# Patient Record
Sex: Male | Born: 1960 | Race: Black or African American | Hispanic: No | Marital: Married | Smoking: Never smoker
Health system: Southern US, Community
[De-identification: ages and names within clinical notes are randomized; demographics above are authoritative.]

---

## 2008-02-14 ENCOUNTER — Emergency Department: Payer: Self-pay | Admitting: Emergency Medicine

## 2009-03-11 ENCOUNTER — Emergency Department (HOSPITAL_COMMUNITY): Admission: EM | Admit: 2009-03-11 | Discharge: 2009-03-11 | Payer: Self-pay | Admitting: Emergency Medicine

## 2009-05-06 ENCOUNTER — Ambulatory Visit (HOSPITAL_COMMUNITY): Admission: RE | Admit: 2009-05-06 | Discharge: 2009-05-06 | Payer: Self-pay | Admitting: Chiropractic Medicine

## 2014-10-27 ENCOUNTER — Emergency Department
Admit: 2014-10-27 | Disposition: A | Discharge status: Home / Self Care | Payer: Self-pay | Admitting: Emergency Medicine

## 2014-10-27 LAB — COMPREHENSIVE METABOLIC PANEL
ALBUMIN: 4.3 g/dL
ALK PHOS: 51 U/L
ALT: 22 U/L
ANION GAP: 9 (ref 7–16)
BUN: 15 mg/dL
Bilirubin,Total: 0.8 mg/dL
CALCIUM: 8.8 mg/dL — AB
CO2: 27 mmol/L
Chloride: 103 mmol/L
Creatinine: 1.03 mg/dL
EGFR (African American): >60
EGFR (Non-African Amer.): >60
GLUCOSE: 98 mg/dL
POTASSIUM: 3.5 mmol/L
SGOT(AST): 21 U/L
SODIUM: 139 mmol/L
Total Protein: 7.2 g/dL

## 2014-10-27 LAB — CBC WITH DIFFERENTIAL/PLATELET
BASOS ABS: 0 10*3/uL (ref 0.0–0.1)
Basophil %: 0.4 %
EOS PCT: 0.2 %
Eosinophil #: 0 10*3/uL (ref 0.0–0.7)
HCT: 43.3 % (ref 40.0–52.0)
HGB: 14.3 g/dL (ref 13.0–18.0)
LYMPHS ABS: 0.9 10*3/uL — AB (ref 1.0–3.6)
Lymphocyte %: 12.6 %
MCH: 27 pg (ref 26.0–34.0)
MCHC: 33 g/dL (ref 32.0–36.0)
MCV: 82 fL (ref 80–100)
MONOS PCT: 13.6 %
Monocyte #: 1 x10 3/mm (ref 0.2–1.0)
NEUTROS ABS: 5.4 10*3/uL (ref 1.4–6.5)
Neutrophil %: 73.2 %
PLATELETS: 185 10*3/uL (ref 150–440)
RBC: 5.3 10*6/uL (ref 4.40–5.90)
RDW: 14.3 % (ref 11.5–14.5)
WBC: 7.4 10*3/uL (ref 3.8–10.6)

## 2014-10-27 LAB — TROPONIN I: Troponin-I: <0.03 ng/mL

## 2014-10-27 LAB — LIPASE, BLOOD: LIPASE: 31 U/L

## 2014-10-28 LAB — URINALYSIS, COMPLETE
BACTERIA: NONE SEEN
Bilirubin,UR: NEGATIVE
Blood: NEGATIVE
Ketone: NEGATIVE
LEUKOCYTE ESTERASE: NEGATIVE
NITRITE: NEGATIVE
PROTEIN: NEGATIVE
Ph: 5 (ref 4.5–8.0)
SPECIFIC GRAVITY: 1.014 (ref 1.003–1.030)
Squamous Epithelial: NONE SEEN

## 2014-10-28 LAB — DRUG SCREEN, URINE
Amphetamines, Ur Screen: NEGATIVE
BARBITURATES, UR SCREEN: NEGATIVE
Benzodiazepine, Ur Scrn: NEGATIVE
COCAINE METABOLITE, UR ~~LOC~~: NEGATIVE
Cannabinoid 50 Ng, Ur ~~LOC~~: NEGATIVE
MDMA (Ecstasy)Ur Screen: NEGATIVE
METHADONE, UR SCREEN: NEGATIVE
OPIATE, UR SCREEN: NEGATIVE
PHENCYCLIDINE (PCP) UR S: NEGATIVE
TRICYCLIC, UR SCREEN: NEGATIVE

## 2019-07-21 ENCOUNTER — Encounter: Payer: Self-pay | Admitting: Emergency Medicine

## 2019-07-21 ENCOUNTER — Other Ambulatory Visit: Payer: Self-pay

## 2019-07-21 ENCOUNTER — Emergency Department
Admission: EM | Admit: 2019-07-21 | Discharge: 2019-07-21 | Disposition: A | Discharge status: Home / Self Care | Payer: Self-pay | Attending: Emergency Medicine | Admitting: Emergency Medicine

## 2019-07-21 DIAGNOSIS — M436 Torticollis: Secondary | ICD-10-CM | POA: Insufficient documentation

## 2019-07-21 MED ORDER — TRAMADOL HCL 50 MG PO TABS: 50.0000 mg | ORAL_TABLET | Freq: Four times a day (QID) | ORAL | 0 refills | Status: DC | PRN

## 2019-07-21 MED ORDER — IBUPROFEN 600 MG PO TABS: 600.0000 mg | ORAL_TABLET | Freq: Three times a day (TID) | ORAL | 0 refills | Status: DC | PRN

## 2019-07-21 MED ORDER — CYCLOBENZAPRINE HCL 10 MG PO TABS: 10.0000 mg | ORAL_TABLET | Freq: Three times a day (TID) | ORAL | 0 refills | Status: DC | PRN

## 2019-07-21 NOTE — Discharge Instructions (Addendum)
Follow discharge care instruction take medication as directed.  Be advised medication may cause drowsiness. 

## 2019-07-21 NOTE — ED Provider Notes (Signed)
[facility] Emergency Department Provider Note   ____________________________________________   First MD Initiated Contact with Patient 07/21/19 [phone]     (approximate)  I have reviewed the triage vital signs and the nursing notes.   HISTORY  Chief Complaint Torticollis    HPI Casey Mccormick is a 59 y.o. male patient complain of worsening rib pain for 1 week.  Patient state pain causes headache and has decreased range of motion with lateral movements.  This is a recurrent condition of patient last episode was several months ago.  Patient denies radicular component to his neck pain.  Patient job contributes to this complaint.  Patient works as a Hospital doctor of a truck.  Patient rates pain as 8/10.  Patient described pain is "achy".  No relief with anti-inflammatory medications.        History reviewed. No pertinent past medical history.  There are no problems to display for this patient.   History reviewed. No pertinent surgical history.  Prior to Admission medications   Medication Sig Start Date End Date Taking? Authorizing Provider  cyclobenzaprine (FLEXERIL) 10 MG tablet Take 1 tablet (10 mg total) by mouth 3 (three) times daily as needed. 07/21/19   Joni Reining, PA-C  ibuprofen (ADVIL) 600 MG tablet Take 1 tablet (600 mg total) by mouth every 8 (eight) hours as needed. 07/21/19   Joni Reining, PA-C  traMADol (ULTRAM) 50 MG tablet Take 1 tablet (50 mg total) by mouth every 6 (six) hours as needed. 07/21/19 07/20/20  Joni Reining, PA-C    Allergies Patient has no allergy information on record.  No family history on file.  Social History Social History   Tobacco Use  . Smoking status: Not on file  Substance Use Topics  . Alcohol use: Not on file  . Drug use: Not on file    Review of Systems Constitutional: No fever/chills Eyes: No visual changes. ENT: No sore throat. Cardiovascular: Denies chest pain. Respiratory: Denies shortness of  breath. Gastrointestinal: No abdominal pain.  No nausea, no vomiting.  No diarrhea.  No constipation. Genitourinary: Negative for dysuria. Musculoskeletal: Posterior lateral neck pain. Skin: Negative for rash. Neurological: Positive for headaches, but denies focal weakness or numbness.   ____________________________________________   PHYSICAL EXAM:  VITAL SIGNS: ED Triage Vitals  Enc Vitals Group     BP 07/21/19 0751 133/65     Pulse Rate 07/21/19 0751 93     Resp 07/21/19 0751 18     Temp 07/21/19 0751 98.3 F (36.8 C)     Temp Source 07/21/19 0751 Oral     SpO2 07/21/19 0751 99 %     Weight 07/21/19 0744 194 lb (88 kg)     Height 07/21/19 0744 6\' 2"  (1.88 m)     Head Circumference --      Peak Flow --      Pain Score 07/21/19 0744 8     Pain Loc --      Pain Edu? --      Excl. in GC? --    Constitutional: Alert and oriented. Well appearing and in no acute distress. Neck: No stridor.  No cervical spine tenderness to palpation.  Decreased range of motion with lateral movements by complaint of pain. Cardiovascular: Normal rate, regular rhythm. Grossly normal heart sounds.  Good peripheral circulation. Respiratory: Normal respiratory effort.  No retractions. Lungs CTAB. Neurologic:  Normal speech and language. No gross focal neurologic deficits are appreciated. No gait instability. Skin:  Skin is warm, dry and intact. No rash noted. Psychiatric: Mood and affect are normal. Speech and behavior are normal.  ____________________________________________   LABS (all labs ordered are listed, but only abnormal results are displayed)  Labs Reviewed - No data to display ____________________________________________  EKG   ____________________________________________  RADIOLOGY  ED MD interpretation:    Official radiology report(s): No results found.  ____________________________________________   PROCEDURES  Procedure(s) performed (including Critical  Care):  Procedures   ____________________________________________   INITIAL IMPRESSION / ASSESSMENT AND PLAN / ED COURSE  As part of my medical decision making, I reviewed the following data within the [place]     Patient presents with 1 week of neck pain.  Physical exam consistent with torticollis.  Patient given discharge care instructions.  Patient advised take medication as directed.  Patient advised medication may cause drowsiness.  Patient advised establish care open-door clinic for continued care.   Broox Lonigro was evaluated in Emergency Department on 07/21/2019 for the symptoms described in the history of present illness. He was evaluated in the context of the global COVID-19 pandemic, which necessitated consideration that the patient might be at risk for infection with the SARS-CoV-2 virus that causes COVID-19. Institutional protocols and algorithms that pertain to the evaluation of patients at risk for COVID-19 are in a state of rapid change based on information released by regulatory bodies including the CDC and federal and state organizations. These policies and algorithms were followed during the patient's care in the ED.       ____________________________________________   FINAL CLINICAL IMPRESSION(S) / ED DIAGNOSES  Final diagnoses:  Torticollis, acute     ED Discharge Orders         Ordered    traMADol (ULTRAM) 50 MG tablet  Every 6 hours PRN     07/21/19 0817    cyclobenzaprine (FLEXERIL) 10 MG tablet  3 times daily PRN     07/21/19 0817    ibuprofen (ADVIL) 600 MG tablet  Every 8 hours PRN     07/21/19 0817           Note:  This document was prepared using Dragon voice recognition software and may include unintentional dictation errors.    Sable Feil, PA-C 07/21/19 3810    Earleen Newport, MD 07/21/19 [phone]

## 2019-07-21 NOTE — ED Triage Notes (Signed)
Pt reports he woke up about a week ago and has some pain to his neck. Pt reports the pain has gotten worse over the past week and now he has a HA and it hurts to look left or right, especially left.

## 2019-07-21 NOTE — ED Notes (Signed)
See triage note  Presents with neck pain  States he woke up with pain to neck about 1 week ago   Stats pain increases with movement and now is moving into left shoulder area

## 2019-11-23 ENCOUNTER — Encounter: Payer: Self-pay | Admitting: Emergency Medicine

## 2019-11-23 ENCOUNTER — Emergency Department
Admission: EM | Admit: 2019-11-23 | Discharge: 2019-11-23 | Disposition: A | Discharge status: Home / Self Care | Payer: Self-pay | Attending: Emergency Medicine | Admitting: Emergency Medicine

## 2019-11-23 ENCOUNTER — Other Ambulatory Visit: Payer: Self-pay

## 2019-11-23 DIAGNOSIS — Z5321 Procedure and treatment not carried out due to patient leaving prior to being seen by health care provider: Secondary | ICD-10-CM | POA: Insufficient documentation

## 2019-11-23 DIAGNOSIS — M542 Cervicalgia: Secondary | ICD-10-CM | POA: Insufficient documentation

## 2019-11-23 DIAGNOSIS — M25519 Pain in unspecified shoulder: Secondary | ICD-10-CM | POA: Insufficient documentation

## 2019-11-23 LAB — CBC WITH DIFFERENTIAL/PLATELET
Abs Immature Granulocytes: 0.02 10*3/uL (ref 0.00–0.07)
Basophils Absolute: 0 10*3/uL (ref 0.0–0.1)
Basophils Relative: 1 %
Eosinophils Absolute: 0 10*3/uL (ref 0.0–0.5)
Eosinophils Relative: 1 %
HCT: 40.7 % (ref 39.0–52.0)
Hemoglobin: 13.6 g/dL (ref 13.0–17.0)
Immature Granulocytes: 0 %
Lymphocytes Relative: 42 %
Lymphs Abs: 2.1 10*3/uL (ref 0.7–4.0)
MCH: 27 pg (ref 26.0–34.0)
MCHC: 33.4 g/dL (ref 30.0–36.0)
MCV: 80.8 fL (ref 80.0–100.0)
Monocytes Absolute: 0.6 10*3/uL (ref 0.1–1.0)
Monocytes Relative: 12 %
Neutro Abs: 2.1 10*3/uL (ref 1.7–7.7)
Neutrophils Relative %: 44 %
Platelets: 213 10*3/uL (ref 150–400)
RBC: 5.04 MIL/uL (ref 4.22–5.81)
RDW: 13.8 % (ref 11.5–15.5)
WBC: 4.9 10*3/uL (ref 4.0–10.5)
nRBC: 0 % (ref 0.0–0.2)

## 2019-11-23 LAB — BASIC METABOLIC PANEL
Anion gap: 9 (ref 5–15)
BUN: 19 mg/dL (ref 6–20)
CO2: 28 mmol/L (ref 22–32)
Calcium: 9.5 mg/dL (ref 8.9–10.3)
Chloride: 104 mmol/L (ref 98–111)
Creatinine, Ser: 1.21 mg/dL (ref 0.61–1.24)
GFR calc Af Amer: >60 mL/min (ref >60)
GFR calc non Af Amer: >60 mL/min (ref >60)
Glucose, Bld: 110 mg/dL — ABNORMAL HIGH (ref 70–99)
Potassium: 4.5 mmol/L (ref 3.5–5.1)
Sodium: 141 mmol/L (ref 135–145)

## 2019-11-23 NOTE — ED Triage Notes (Signed)
Pt arrives ambulatory to triage with c/o shoulder pain and neck swelling x 4 nights. Pt denies chest pain or other cardiac symptoms at this time including arm pain. Pt denies injury or trauma. Pt is in NAD.

## 2019-11-27 ENCOUNTER — Other Ambulatory Visit: Payer: Self-pay

## 2019-11-27 ENCOUNTER — Encounter: Payer: Self-pay | Admitting: Emergency Medicine

## 2019-11-27 ENCOUNTER — Emergency Department
Admission: EM | Admit: 2019-11-27 | Discharge: 2019-11-27 | Disposition: A | Discharge status: Home / Self Care | Payer: Self-pay | Attending: Emergency Medicine | Admitting: Emergency Medicine

## 2019-11-27 ENCOUNTER — Emergency Department: Payer: Self-pay

## 2019-11-27 DIAGNOSIS — M436 Torticollis: Secondary | ICD-10-CM | POA: Insufficient documentation

## 2019-11-27 MED ORDER — KETOROLAC TROMETHAMINE 30 MG/ML IJ SOLN
30.0000 mg | Freq: Once | INTRAMUSCULAR | Status: AC
  Administered 2019-11-27: 30 mg via INTRAMUSCULAR
  Filled 2019-11-27: qty 1

## 2019-11-27 NOTE — Discharge Instructions (Addendum)
Patient was given instructions that were printed off on downtime information.

## 2019-11-27 NOTE — ED Provider Notes (Signed)
[facility] Emergency Department Provider Note   ____________________________________________   First MD Initiated Contact with Patient 11/27/19 1038     (approximate)  I have reviewed the triage vital signs and the nursing notes.   HISTORY  Chief Complaint Neck Pain and Shoulder Pain    HPI Casey Mccormick is a 59 y.o. male presents to the ED with complaint of left-sided cervical pain that started approximately 2 weeks ago.  He states for the last week it is gotten worse.  Patient denies any injury to his neck.  He describes it as a "tightness" and pain is increased with lateral range of motion.  He denies any paresthesias into his upper extremities.  There is been no weakness with lifting.  Patient reports that he came to the ED 4 days ago but left without being seen due to long wait time.  He rates pain as a 10/10.       History reviewed. No pertinent past medical history.  There are no problems to display for this patient.   History reviewed. No pertinent surgical history.  Prior to Admission medications   Not on File    Allergies Patient has no known allergies.  No family history on file.  Social History Social History   Tobacco Use  . Smoking status: Never Smoker  . Smokeless tobacco: Never Used  Substance Use Topics  . Alcohol use: Never  . Drug use: Never    Review of Systems Constitutional: No fever/chills Eyes: No visual changes. ENT: No sore throat. Cardiovascular: Denies chest pain. Respiratory: Denies shortness of breath. Gastrointestinal: No abdominal pain.  No nausea, no vomiting.  Musculoskeletal: As of her cervical pain. Skin: Negative for rash. Neurological: Negative for headaches, focal weakness or numbness. ____________________________________________   PHYSICAL EXAM:  VITAL SIGNS: ED Triage Vitals [11/27/19 1016]  Enc Vitals Group     BP 126/74     Pulse Rate 75     Resp 16     Temp 98.4 F (36.9 C)       Temp Source Oral     SpO2 99 %     Weight 192 lb (87.1 kg)     Height 6\' 2"  (1.88 m)     Head Circumference      Peak Flow      Pain Score 10     Pain Loc      Pain Edu?      Excl. in GC?    Constitutional: Alert and oriented. Well appearing and in no acute distress. Eyes: Conjunctivae are normal. PERRL. EOMI. Head: Atraumatic. Nose: No congestion/rhinnorhea. Mouth/Throat: Mucous membranes are moist.  Oropharynx non-erythematous. Neck: No stridor.  No point tenderness on palpation of cervical spine posteriorly.  There is moderate tenderness on palpation of the left cervical muscles and into the left trapezius muscle.  Range of motion with flexion extension is without any difficulty.  Lateral movement especially to the left is more difficult. Hematological/Lymphatic/Immunilogical: No cervical lymphadenopathy. Cardiovascular: Normal rate, regular rhythm. Grossly normal heart sounds.  Good peripheral circulation. Respiratory: Normal respiratory effort.  No retractions. Lungs CTAB. Musculoskeletal: Moves upper and lower extremities that any difficulty.  There is no tenderness on palpation of the thoracic or lumbar spine.  Good muscle strength bilaterally.  Patient is able to ambulate without any assistance. Neurologic:  Normal speech and language. No gross focal neurologic deficits are appreciated. No gait instability. Skin:  Skin is warm, dry and intact. Psychiatric: Mood and affect are  normal. Speech and behavior are normal.  ____________________________________________   LABS (all labs ordered are listed, but only abnormal results are displayed)  Labs Reviewed - No data to display  RADIOLOGY   Official radiology report(s): DG Cervical Spine 2-3 Views  Result Date: 11/27/2019 CLINICAL DATA:  Cervicalgia EXAM: CERVICAL SPINE - 2-3 VIEW COMPARISON:  None. FINDINGS: Frontal, lateral, and open-mouth odontoid images were obtained. There is no fracture or spondylolisthesis.  Prevertebral soft tissues and predental space regions are normal. There is moderate disc space narrowing at C5-6 and C6-7 with slightly milder disc space narrowing at C4-5 and C7-T1. There are anterior osteophytes at C3, C4, C5, and C6. Foci of calcification in the anterior longitudinal ligament noted at C4-5 and C6-7. No erosive changes. There is lack of lordosis in the cervical region. Lung apices are clear. IMPRESSION: Osteoarthritic change at several levels. No fracture or spondylolisthesis. Lack of cervical lordosis likely represents a degree of muscle spasm. Electronically Signed   By: Lowella Grip III M.D.   On: 11/27/2019 11:40    ____________________________________________   PROCEDURES  Procedure(s) performed (including Critical Care):  Procedures  ____________________________________________   INITIAL IMPRESSION / ASSESSMENT AND PLAN / ED COURSE  As part of my medical decision making, I reviewed the following data within the electronic MEDICAL RECORD NUMBER Notes from prior ED visits and [place] Controlled Substance Database   59 year old male presents to the ED with complaint of left-sided cervical pain without history of injury for the last 6 days.  Patient range of motion is decreased secondary to discomfort.  No gross deformity was noted.  There is moderate tenderness on palpation of the left cervical and trapezius muscles.  X-rays were negative for any acute bony changes but more consistent with muscle spasms.  Patient was given Toradol prior to x-rays and at the time of discharge states that he is having no pain.  Range of motion has improved.  Patient was discharged with prescription for naproxen and methocarbamol on paper prescription due to unexpected epic downtime.  ____________________________________________   FINAL CLINICAL IMPRESSION(S) / ED DIAGNOSES  Final diagnoses:  Acute torticollis     ED Discharge Orders    None       Note:  This document was prepared  using Dragon voice recognition software and may include unintentional dictation errors.    Johnn Hai, PA-C 11/27/19 1420    Vanessa [place], MD 11/27/19 [phone]

## 2019-11-27 NOTE — ED Triage Notes (Signed)
Patient presents to the ED with left shoulder and left sided neck pain.  Patient describes pain as tightness.  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  Patient denies any known injury but states, "I think I probably moved funny when this started."  Patient states he has had pain x 2 weeks.  Patient was in the ED for the same 4 days ago but left before being seen due to long wait.

## 2019-11-27 NOTE — ED Notes (Signed)
See triage note  Presents with left sided neck pain and posterior left shoulder pain  States pain started about 2 weeks ago but became worse about 1 week ago  Denies any trauma

## 2020-10-23 IMAGING — CR DG CERVICAL SPINE 2 OR 3 VIEWS
1 series · 3 of 3 positions shown · non-contrast
Comparison: None.

CLINICAL DATA: Cervicalgia

EXAM:
CERVICAL SPINE - 2-3 VIEW

[Series 1: dg cervical spine 2 or 3 views · 0.14mm/px · 3 of 3 slices shown]
[im 1/3]
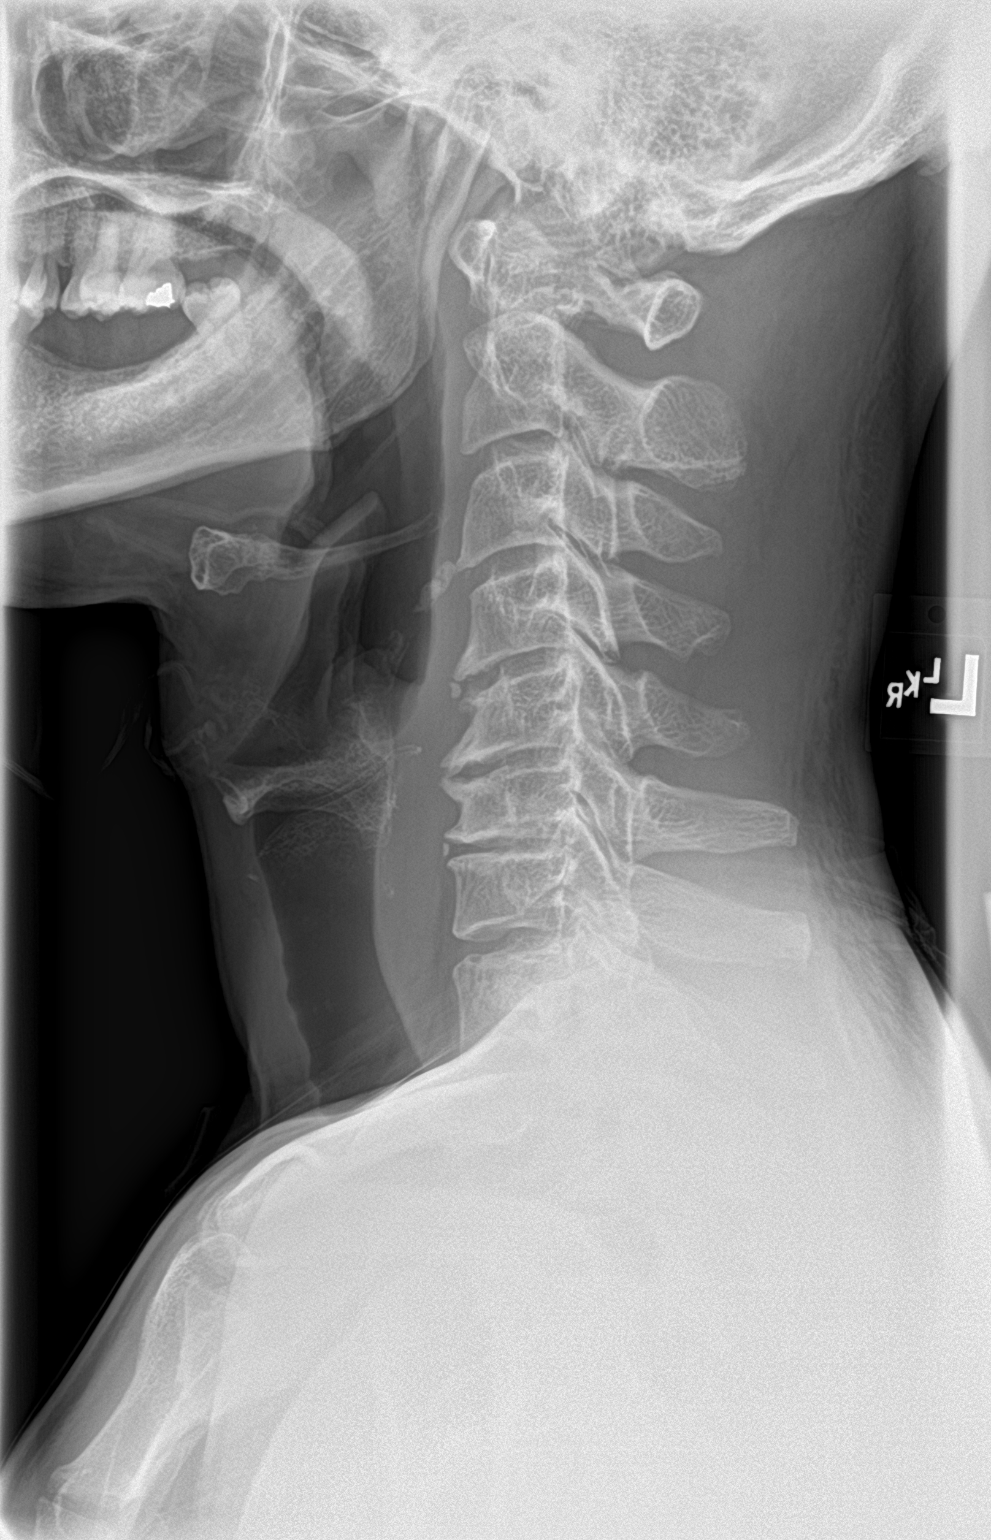
[im 2/3]
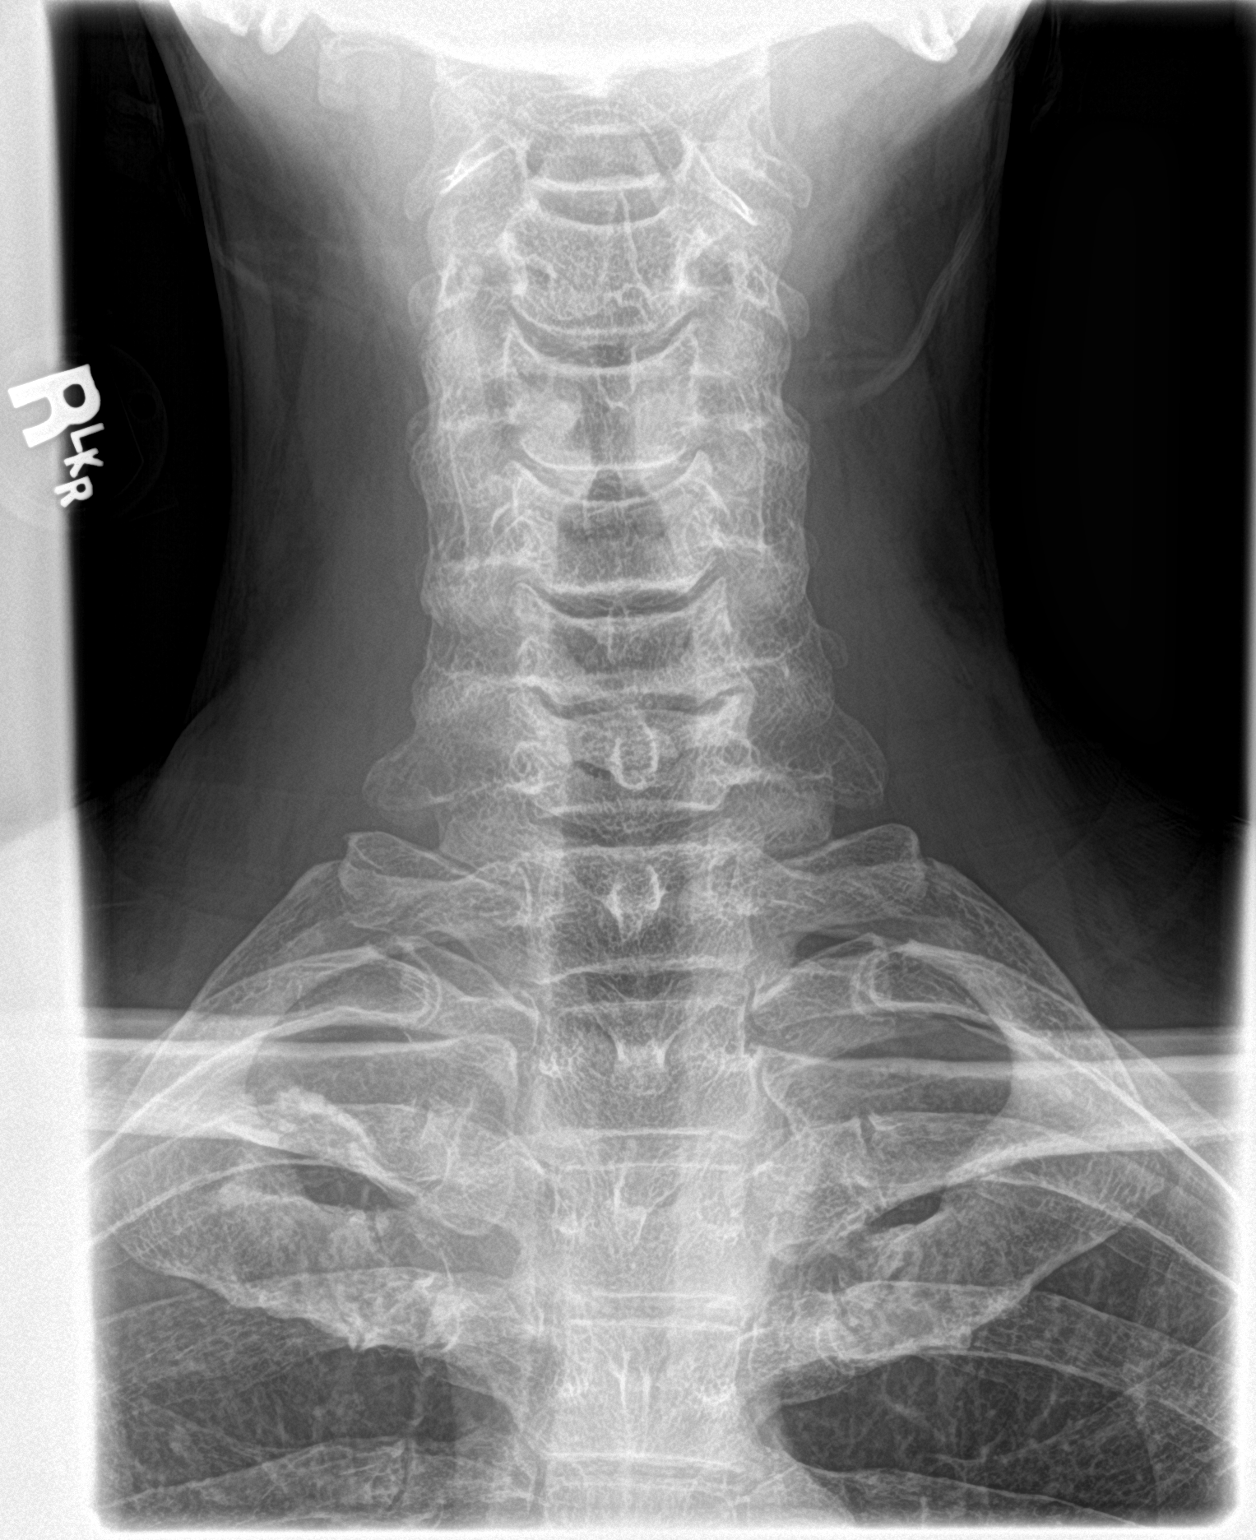
[im 3/3]
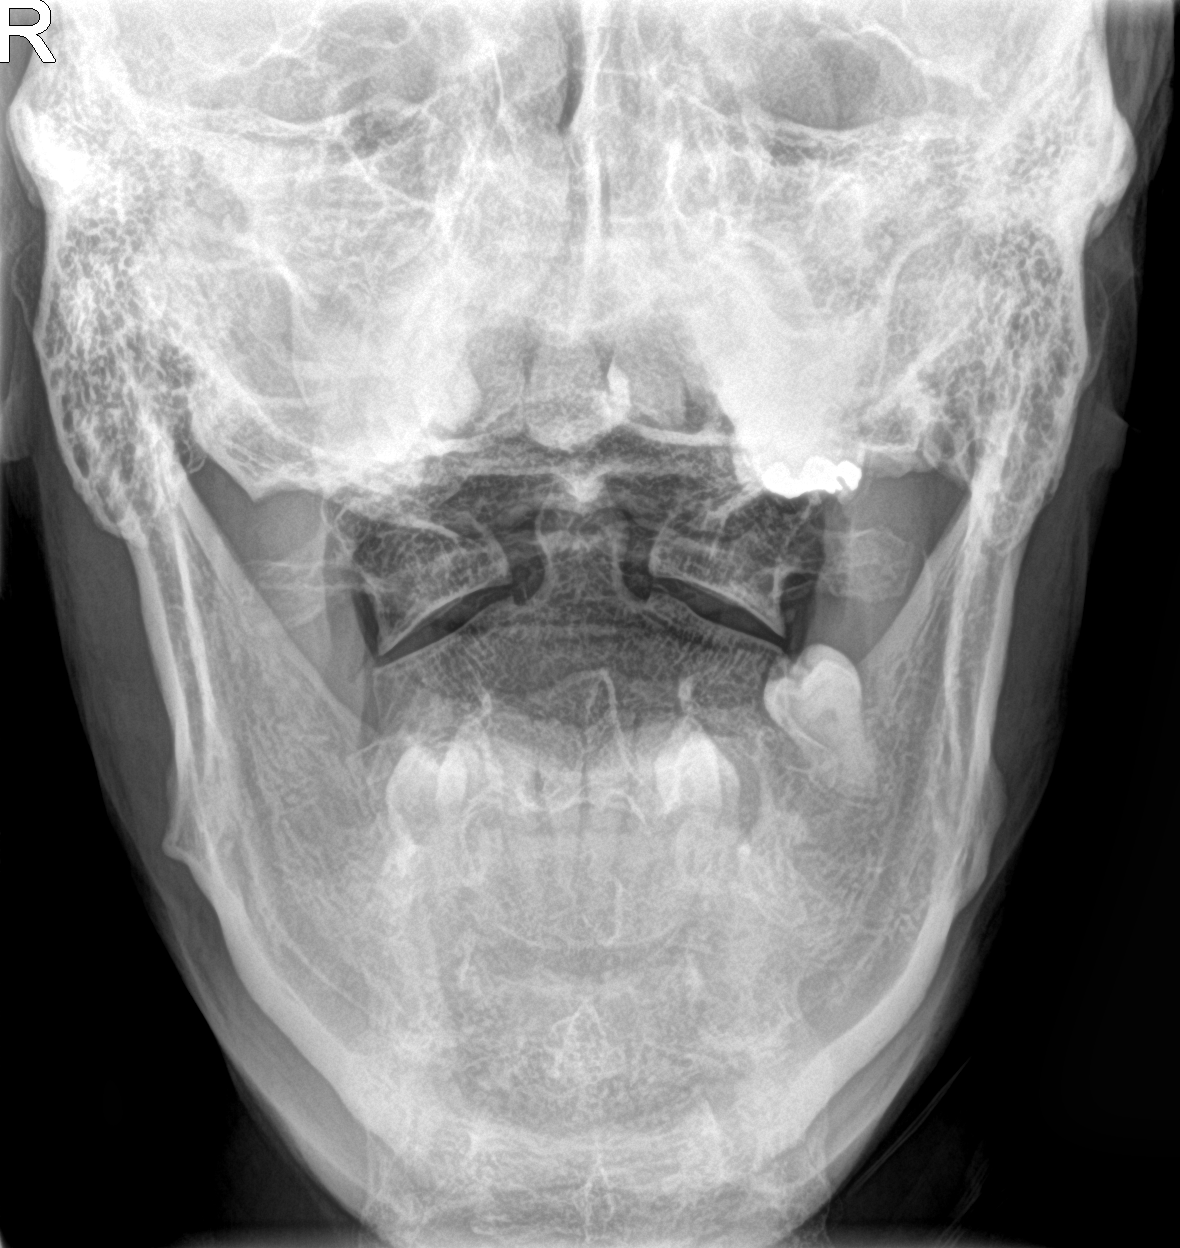

[3 of 3 positions shown; findings below may reference images not displayed]

FINDINGS: Frontal, lateral, and open-mouth odontoid images were obtained.
There is no fracture or spondylolisthesis. Prevertebral soft tissues
and predental space regions are normal. There is moderate disc space
narrowing at C5-6 and C6-7 with slightly milder disc space narrowing
at C4-5 and C7-T1. There are anterior osteophytes at C3, C4, C5, and
C6. Foci of calcification in the anterior longitudinal ligament
noted at C4-5 and C6-7. No erosive changes.

There is lack of lordosis in the cervical region. Lung apices are
clear.
IMPRESSION: Osteoarthritic change at several levels. No fracture or
spondylolisthesis. Lack of cervical lordosis likely represents a
degree of muscle spasm.

## 2021-03-02 ENCOUNTER — Emergency Department
Admission: EM | Admit: 2021-03-02 | Discharge: 2021-03-02 | Disposition: A | Discharge status: Home / Self Care | Payer: Self-pay | Attending: Student in an Organized Health Care Education/Training Program | Admitting: Student in an Organized Health Care Education/Training Program

## 2021-03-02 ENCOUNTER — Other Ambulatory Visit: Payer: Self-pay

## 2021-03-02 ENCOUNTER — Emergency Department: Payer: Self-pay

## 2021-03-02 DIAGNOSIS — R0600 Dyspnea, unspecified: Secondary | ICD-10-CM

## 2021-03-02 DIAGNOSIS — R0602 Shortness of breath: Secondary | ICD-10-CM | POA: Insufficient documentation

## 2021-03-02 DIAGNOSIS — R0789 Other chest pain: Secondary | ICD-10-CM | POA: Insufficient documentation

## 2021-03-02 LAB — BASIC METABOLIC PANEL
Anion gap: 6 (ref 5–15)
BUN: 15 mg/dL (ref 6–20)
CO2: 29 mmol/L (ref 22–32)
Calcium: 9.6 mg/dL (ref 8.9–10.3)
Chloride: 105 mmol/L (ref 98–111)
Creatinine, Ser: 1.17 mg/dL (ref 0.61–1.24)
GFR, Estimated: >60 mL/min (ref >60)
Glucose, Bld: 77 mg/dL (ref 70–99)
Potassium: 4.3 mmol/L (ref 3.5–5.1)
Sodium: 140 mmol/L (ref 135–145)

## 2021-03-02 LAB — CBC
HCT: 43.6 % (ref 39.0–52.0)
Hemoglobin: 14.4 g/dL (ref 13.0–17.0)
MCH: 27 pg (ref 26.0–34.0)
MCHC: 33 g/dL (ref 30.0–36.0)
MCV: 81.6 fL (ref 80.0–100.0)
Platelets: 220 10*3/uL (ref 150–400)
RBC: 5.34 MIL/uL (ref 4.22–5.81)
RDW: 14.2 % (ref 11.5–15.5)
WBC: 5.5 10*3/uL (ref 4.0–10.5)
nRBC: 0 % (ref 0.0–0.2)

## 2021-03-02 LAB — D-DIMER, QUANTITATIVE: D-Dimer, Quant: 0.3 ug/mL-FEU (ref 0.00–0.50)

## 2021-03-02 LAB — TROPONIN I (HIGH SENSITIVITY)
Troponin I (High Sensitivity): 4 ng/L (ref <18)
Troponin I (High Sensitivity): 4 ng/L (ref <18)

## 2021-03-02 NOTE — ED Provider Notes (Signed)
[facility] Emergency Department Provider Note    Event Date/Time   First MD Initiated Contact with Patient 03/02/21 1612     (approximate)  I have reviewed the triage vital signs and the nursing notes.   HISTORY  Chief Complaint Shortness of Breath    HPI Casey Mccormick is a 60 y.o. male with no significant past medical history presents to the E for evaluation of episode of "uneasiness" and shortness of breath that occurred several days ago.  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  Denies any chest pain or pressure at this time.  States he went to be seen at urgent care and was told that his EKG was abnormal and to be seen in the ER.  States has not had any sort of complications with chest pain or pressure.  Does not smoke.  Does not drink alcohol or use illicit drugs.  History reviewed. No pertinent past medical history. No family history on file. History reviewed. No pertinent surgical history. There are no problems to display for this patient.     Prior to Admission medications   Not on File    Allergies Patient has no known allergies.    Social History Social History   Tobacco Use   Smoking status: Never   Smokeless tobacco: Never  Substance Use Topics   Alcohol use: Never   Drug use: Never    Review of Systems Patient denies headaches, rhinorrhea, blurry vision, numbness, shortness of breath, chest pain, edema, cough, abdominal pain, nausea, vomiting, diarrhea, dysuria, fevers, rashes or hallucinations unless otherwise stated above in HPI. ____________________________________________   PHYSICAL EXAM:  VITAL SIGNS: Vitals:   03/02/21 1236 03/02/21 1515  BP: 120/68 (!) 178/76  Pulse: 82 80  Resp: 16 16  Temp: 98.4 F (36.9 C) 98.4 F (36.9 C)  SpO2: 100% 100%    Constitutional: Alert and oriented.  Eyes: Conjunctivae are normal.  Head: Atraumatic. Nose: No congestion/rhinnorhea. Mouth/Throat: Mucous membranes are moist.   Neck: No  stridor. Painless ROM.  Cardiovascular: Normal rate, regular rhythm. Grossly normal heart sounds.  Good peripheral circulation. Respiratory: Normal respiratory effort.  No retractions. Lungs CTAB. Gastrointestinal: Soft and nontender. No distention. No abdominal bruits. No CVA tenderness. Genitourinary:  Musculoskeletal: No lower extremity tenderness nor edema.  No joint effusions. Neurologic:  Normal speech and language. No gross focal neurologic deficits are appreciated. No facial droop Skin:  Skin is warm, dry and intact. No rash noted. Psychiatric: Mood and affect are normal. Speech and behavior are normal.  ____________________________________________   LABS (all labs ordered are listed, but only abnormal results are displayed)  Results for orders placed or performed during the hospital encounter of 03/02/21 (from the past 24 hour(s))  Basic metabolic panel     Status: None   Collection Time: 03/02/21 12:36 PM  Result Value Ref Range   Sodium 140 135 - 145 mmol/L   Potassium 4.3 3.5 - 5.1 mmol/L   Chloride 105 98 - 111 mmol/L   CO2 29 22 - 32 mmol/L   Glucose, Bld 77 70 - 99 mg/dL   BUN 15 6 - 20 mg/dL   Creatinine, Ser 7.61 0.61 - 1.24 mg/dL   Calcium 9.6 8.9 - 60.7 mg/dL   GFR, Estimated >37 >10 mL/min   Anion gap 6 5 - 15  CBC     Status: None   Collection Time: 03/02/21 12:36 PM  Result Value Ref Range   WBC 5.5 4.0 - 10.5 K/uL  RBC 5.34 4.22 - 5.81 MIL/uL   Hemoglobin 14.4 13.0 - 17.0 g/dL   HCT 14.7 82.9 - 56.2 %   MCV 81.6 80.0 - 100.0 fL   MCH 27.0 26.0 - 34.0 pg   MCHC 33.0 30.0 - 36.0 g/dL   RDW 13.0 86.5 - 78.4 %   Platelets 220 150 - 400 K/uL   nRBC 0.0 0.0 - 0.2 %  Troponin I (High Sensitivity)     Status: None   Collection Time: 03/02/21 12:36 PM  Result Value Ref Range   Troponin I (High Sensitivity) 4 <18 ng/L  D-dimer, quantitative     Status: None   Collection Time: 03/02/21 12:36 PM  Result Value Ref Range   D-Dimer, Quant 0.30 0.00 - 0.50  ug/mL-FEU  Troponin I (High Sensitivity)     Status: None   Collection Time: 03/02/21  3:24 PM  Result Value Ref Range   Troponin I (High Sensitivity) 4 <18 ng/L   ____________________________________________  EKG My review and personal interpretation at Time: 12:41   Indication: sob  Rate: 85  Rhythm: sinus with pvcs Axis: normal Other: normal intervals, no stemi ____________________________________________  RADIOLOGY  I personally reviewed all radiographic images ordered to evaluate for the above acute complaints and reviewed radiology reports and findings.  These findings were personally discussed with the patient.  Please see medical record for radiology report.  ____________________________________________   PROCEDURES  Procedure(s) performed:  Procedures    Critical Care performed: no ____________________________________________   INITIAL IMPRESSION / ASSESSMENT AND PLAN / ED COURSE  Pertinent labs & imaging results that were available during my care of the patient were reviewed by me and considered in my medical decision making (see chart for details).   DDX: Electrolyte abnormality, anemia, ACS, CHF, PE ED, nodule, pneumonia, bronchitis  Casey Mccormick is a 60 y.o. who presents to the ED with presentation as described above.  Patient well-appearing asymptomatic at this time has not had any sort of discomfort or dyspnea for several days.  D-dimer sent to further stratify for PE is negative.  Does not have any signs or symptoms of PE or DVT.  EKG with occasional PVCs but patient adamantly denying any chest pain or pressure.  Serial enzymes are negative.  Chest x-ray shows evidence of pulmonary nodule and I did recommend CT imaging to further characterize with patient states that he is unable to wait for additional testing at this time prefer referral to outpatient services which I think is reasonable.  Will be given referral to pulmonary nodule clinic as well as cardiology.   Discussed signs and symptoms for which he should return to the ER.     The patient was evaluated in Emergency Department today for the symptoms described in the history of present illness. He/she was evaluated in the context of the global COVID-19 pandemic, which necessitated consideration that the patient might be at risk for infection with the SARS-CoV-2 virus that causes COVID-19. Institutional protocols and algorithms that pertain to the evaluation of patients at risk for COVID-19 are in a state of rapid change based on information released by regulatory bodies including the CDC and federal and state organizations. These policies and algorithms were followed during the patient's care in the ED.  As part of my medical decision making, I reviewed the following data within the electronic MEDICAL RECORD NUMBER Nursing notes reviewed and incorporated, Labs reviewed, notes from prior ED visits and [place] Controlled Substance Database   ____________________________________________  FINAL CLINICAL IMPRESSION(S) / ED DIAGNOSES  Final diagnoses:  Dyspnea, unspecified type      NEW MEDICATIONS STARTED DURING THIS VISIT:  New Prescriptions   No medications on file     Note:  This document was prepared using Dragon voice recognition software and may include unintentional dictation errors.    Willy Eddy, MD 03/02/21 [phone]

## 2021-03-02 NOTE — ED Triage Notes (Signed)
Pt to ED sent by Dr for abnormal EKG. Pt reports shob for the past few weeks. Intermittent chest discomfort, denies pain at this time. Denies nausea Ambulatory, NAD noted

## 2021-03-03 ENCOUNTER — Telehealth: Payer: Self-pay | Admitting: *Deleted

## 2021-03-03 DIAGNOSIS — R911 Solitary pulmonary nodule: Secondary | ICD-10-CM

## 2021-03-03 NOTE — Telephone Encounter (Signed)
Pt made aware of referral to [facility] from PCP. Pt is in agreement to have upcoming CT scan and follow up as recommended.  Pt has been made aware of upcoming appts for follow up CT scan and follow up appt with Jennifer Burns, NP in the [facility]. Pt verbalized understanding. Nothing further needed at this time.  

## 2021-03-04 ENCOUNTER — Ambulatory Visit: Payer: Self-pay | Attending: Oncology

## 2021-03-08 ENCOUNTER — Inpatient Hospital Stay: Payer: Self-pay | Admitting: Oncology

## 2021-03-08 ENCOUNTER — Telehealth: Payer: Self-pay | Admitting: *Deleted

## 2021-03-08 NOTE — Telephone Encounter (Signed)
Pt has been made aware of upcoming appts for follow up CT scan and follow up appt with Jennifer Burns, NP in the [facility]. Pt verbalized understanding. Nothing further needed at this time. 

## 2021-03-11 ENCOUNTER — Ambulatory Visit: Payer: Self-pay

## 2021-03-14 ENCOUNTER — Other Ambulatory Visit: Payer: Self-pay

## 2021-03-14 ENCOUNTER — Ambulatory Visit
Admission: RE | Admit: 2021-03-14 | Discharge: 2021-03-14 | Disposition: A | Discharge status: Home / Self Care | Payer: Self-pay | Source: Ambulatory Visit | Attending: Oncology | Admitting: Oncology

## 2021-03-14 DIAGNOSIS — R911 Solitary pulmonary nodule: Secondary | ICD-10-CM | POA: Insufficient documentation

## 2021-03-15 ENCOUNTER — Inpatient Hospital Stay: Payer: Self-pay | Attending: Oncology | Admitting: Oncology

## 2021-03-17 ENCOUNTER — Inpatient Hospital Stay (HOSPITAL_BASED_OUTPATIENT_CLINIC_OR_DEPARTMENT_OTHER): Payer: Self-pay | Admitting: Oncology

## 2021-03-17 ENCOUNTER — Other Ambulatory Visit: Payer: Self-pay

## 2021-03-17 DIAGNOSIS — R911 Solitary pulmonary nodule: Secondary | ICD-10-CM

## 2021-03-17 DIAGNOSIS — R002 Palpitations: Secondary | ICD-10-CM

## 2021-03-17 NOTE — Progress Notes (Signed)
Pulmonary Nodule Clinic Consult note [facility]  Telephone:([phone] [phone]  Patient Care Team: Patient, No Pcp Per (Inactive) as PCP - General (General Practice) Ziglar, Eli Phillips, MD as Referring Physician (Family Medicine)   Name of the patient: Casey Mccormick  [id number]  [birth year]   Date of visit: 03/17/2021   Diagnosis- Lung Nodule  Virtual Visit via Telephone Note   I connected with Casey Mccormick on 03/17/21 at 10:30am by telephone visit and verified that I am speaking with the correct person using two identifiers.   I discussed the limitations, risks, security and privacy concerns of performing an evaluation and management service by telemedicine and the availability of in-person appointments. I also discussed with the patient that there may be a patient responsible charge related to this service. The patient expressed understanding and agreed to proceed.   Other persons participating in the visit and their role in the encounter:   Patient's location: Home  Provider's location: Clinic  Chief complaint/ Reason for visit- Pulmonary Nodule Clinic Initial Visit  Past Medical History: Casey Mccormick is a 60 year old male with no past medical history who was evaluated in the emergency room on 03/02/2021 for shortness of breath.  Work-up included imaging which showed possible pulmonary nodules in the right upper chest at 50 mm in size.  Recommended a follow-up chest CT for further evaluation.  Interval history-Casey Mccormick presents today to discuss most recent CT scan.  Patient has never smoked or drink alcohol.  He is married and lives at home with his wife.  He denies any exposure to secondhand smoke.  He denies any occupational exposure.  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  He travels all over the country.   He denies any family history of cancer.  Reports a fluttering feeling in his chest intermittently starting  about 2 weeks ago.  States that is when he was seen in the emergency room and it has persisted.  He does not have a primary care provider.  Has intermittent shortness of breath with palpitations.  Denies any true chest pain.  Denies nausea, vomiting, constipation or diarrhea.  Denies fevers or recent illness.  ECOG FS:0 - Asymptomatic  Review of systems- Review of Systems  Respiratory:  Positive for shortness of breath.   Cardiovascular:  Positive for palpitations.       Fluttering in his chest  All other systems reviewed and are negative.   No Known Allergies   No past medical history on file.   No past surgical history on file.  Social History   Socioeconomic History   Marital status: Married    Spouse name: Not on file   Number of children: Not on file   Years of education: Not on file   Highest education level: Not on file  Occupational History   Not on file  Tobacco Use   Smoking status: Never   Smokeless tobacco: Never  Substance and Sexual Activity   Alcohol use: Never   Drug use: Never   Sexual activity: Not on file  Other Topics Concern   Not on file  Social History Narrative   Not on file   Social Determinants of Health   Financial Resource Strain: Not on file  Food Insecurity: Not on file  Transportation Needs: Not on file  Physical Activity: Not on file  Stress: Not on file  Social Connections: Not on file  Intimate Partner Violence: Not on file  No family history on file.  No current outpatient medications on file.  Physical exam: There were no vitals filed for this visit. Physical Exam Neurological:     Mental Status: He is alert and oriented to person, place, and time.     CMP Latest Ref Rng & Units 03/02/2021  Glucose 70 - 99 mg/dL 77  BUN 6 - 20 mg/dL 15  Creatinine 0.48 - 8.89 mg/dL 1.69  Sodium 450 - 388 mmol/L 140  Potassium 3.5 - 5.1 mmol/L 4.3  Chloride 98 - 111 mmol/L 105  CO2 22 - 32 mmol/L 29  Calcium 8.9 - 10.3 mg/dL 9.6   Total Protein g/dL -  Total Bilirubin mg/dL -  Alkaline Phos U/L -  AST U/L -  ALT U/L -   CBC Latest Ref Rng & Units 03/02/2021  WBC 4.0 - 10.5 K/uL 5.5  Hemoglobin 13.0 - 17.0 g/dL 82.8  Hematocrit 00.3 - 52.0 % 43.6  Platelets 150 - 400 K/uL 220    No images are attached to the encounter.  DG Chest 2 View  Result Date: 03/02/2021 CLINICAL DATA:  Abnormal EKG and shortness of breath the past few weeks with intermittent chest discomfort. EXAM: CHEST - 2 VIEW COMPARISON:  None FINDINGS: Trachea is midline. Cardiomediastinal contours and hilar structures are unremarkable. No sign of lobar consolidative process. No visible pneumothorax or pleural effusion. 15 mm density projects over the RIGHT upper chest medial to the scapula. Tiny nodular density at the LEFT lung base projecting over the LEFT posterior ninth rib measures 7 mm, potentially calcified. On limited assessment there is no acute skeletal process. IMPRESSION: Findings suspicious for pulmonary nodules largest in the RIGHT upper chest at 15 mm. Suggest chest CT for further evaluation. No lobar consolidation or effusion. Electronically Signed   By: Donzetta Kohut M.D.   On: 03/02/2021 14:02   CT Chest Wo Contrast  Result Date: 03/14/2021 CLINICAL DATA:  Pulmonary nodule on chest radiograph EXAM: CT CHEST WITHOUT CONTRAST TECHNIQUE: Multidetector CT imaging of the chest was performed following the standard protocol without IV contrast. COMPARISON:  Chest radiograph dated 03/02/2021 FINDINGS: Cardiovascular: The heart is normal in size. No pericardial effusion. No evidence of thoracic aortic aneurysm. Very mild coronary atherosclerosis of the LAD. Mediastinum/Nodes: Calcified mediastinal and right hilar nodes. Visualized thyroid is unremarkable. Lungs/Pleura: Minimal biapical pleural-parenchymal scarring. No focal consolidation. Clustered granulomata in the right lower lobe (series 3/image 145), benign. No suspicious pulmonary nodules.  Specifically, no right upper lobe nodule to correspond to the radiographic abnormality, which may have been osseous. No pleural effusion or pneumothorax. Upper Abdomen: Visualized upper abdomen is grossly unremarkable. Musculoskeletal: Visualized osseous structures are within normal limits. IMPRESSION: Sequela of prior granulomatous disease, as above. No suspicious pulmonary nodules. Specifically, no right upper lobe nodule to correspond to the radiographic abnormality. Electronically Signed   By: Charline Bills M.D.   On: 03/14/2021 21:29     Assessment and plan- Patient is a 60 y.o. male who presents to pulmonary nodule clinic for follow-up of incidental lung nodules.  A telephone visit was conducted to review most recent CT scan results.    CT scan without contrast from 03/14/2021 shows sequelae of prior granulomatosis disease and no suspicious pulmonary nodules.  Specifically no right upper lobe nodule to correspond to prior films.   Calculating malignancy probability of a pulmonary nodule: Risk factors include: 1.  Age. 2.  Cancer history. 3.  Diameter of pulmonary nodule and mm 4.  Location 5.  Smoking history 6.  Spiculation present   Based on risk factors, this patient is low risk for the development of lung cancer.  No additional follow-up is needed in our clinic.     Assessment/Plan- Patient has never smoked nor had any exposure to toxins known to cause cancer.  He is very low risk for the development of lung cancer.  Sounds like his family is fairly healthy as well.  No additional follow-up is needed.  I do recommend he see a cardiologist for the fluttering in his chest.  He does not have a PCP.  I will make referral for him although recommend establishing care with a PCP.  Visit Diagnosis 1. Lung nodule     Patient expressed understanding and was in agreement with this plan. He also understands that He can call clinic at any time with any questions, concerns, or complaints.    Greater than 50% was spent in counseling and coordination of care with this patient including but not limited to discussion of the relevant topics above (See A&P) including, but not limited to diagnosis and management of acute and chronic medical conditions.   Thank you for allowing me to participate in the care of this very pleasant patient.    Mauro Kaufmann, NP [facility] Cell - [phone] Pager- [phone] 03/17/2021 11:44 AM

## 2021-05-04 ENCOUNTER — Ambulatory Visit: Payer: Self-pay | Admitting: Cardiovascular Disease

## 2021-05-04 NOTE — Progress Notes (Deleted)
Cardiology Office Note  Date:  05/04/2021   ID:  Casey Mccormick, DOB [birth year], MRN [id number]  PCP:  Patient, No Pcp Per (Inactive)   No chief complaint on file.   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  States that is when he was seen in the emergency room and it has persisted.  He does not have a primary care provider.  Has intermittent shortness of breath with palpitations  CT chest   Seen by pulmonary, no further work-up needed for lung nodule   PMH:   has no past medical history on file.  PSH:   No past surgical history on file.  No current outpatient medications on file.   No current facility-administered medications for this visit.     Allergies:   Patient has no known allergies.   Social History:  The patient  reports that he has never smoked. He has never used smokeless tobacco. He reports that he does not drink alcohol and does not use drugs.   Family History:   family history is not on file.    Review of Systems: ROS   PHYSICAL EXAM: VS:  There were no vitals taken for this visit. , BMI There is no height or weight on file to calculate BMI. GEN: Well nourished, well developed, in no acute distress HEENT: normal Neck: no JVD, carotid bruits, or masses Cardiac: RRR; no murmurs, rubs, or gallops,no edema  Respiratory:  clear to auscultation bilaterally, normal work of breathing GI: soft, nontender, nondistended, + BS MS: no deformity or atrophy Skin: warm and dry, no rash Neuro:  Strength and sensation are intact Psych: euthymic mood, full affect    Recent Labs: 03/02/2021: BUN 15; Creatinine, Ser 1.17; Hemoglobin  14.4; Platelets 220; Potassium 4.3; Sodium 140    Lipid Panel No results found for: CHOL, HDL, LDLCALC, TRIG    Wt Readings from Last 3 Encounters:  03/02/21 185 lb (83.9 kg)  11/27/19 192 lb (87.1 kg)  07/21/19 194 lb (88 kg)       ASSESSMENT AND PLAN:  Problem List Items Addressed This Visit   None    Disposition:   F/U  12 months   Total encounter time more than 25 minutes  Greater than 50% was spent in counseling and coordination of care with the patient    Signed, Dossie Arbour, M.D., Ph.D. [facility] [place], [place] [phone]

## 2021-05-06 ENCOUNTER — Encounter: Payer: Self-pay | Admitting: Cardiovascular Disease

## 2021-05-11 ENCOUNTER — Telehealth: Payer: Self-pay | Admitting: Oncology

## 2021-05-11 NOTE — Telephone Encounter (Signed)
Received fax from [facility]- that the patient was a No Show for the New Patient consult on 05/04/21.  Referral [id number] was closed and patient was mailed a letter per fax.

## 2022-02-08 IMAGING — CT CT CHEST W/O CM
2 of 4 series · 15 of 36 positions shown, 18 images · non-contrast
Comparison: Chest radiograph dated 03/02/2021

CLINICAL DATA: Pulmonary nodule on chest radiograph

EXAM:
CT CHEST WITHOUT CONTRAST
TECHNIQUE: Multidetector CT imaging of the chest was performed following the
standard protocol without IV contrast.

[Series 2: chest 2.00 · axial · 0.81mm/px · z∈[-1276,-934]mm · 12 of 203 slices shown, 15 images]
[im 16/203  mediastinal]
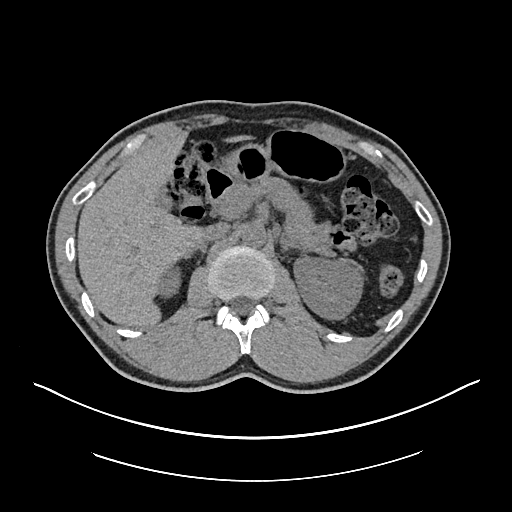
[im 16/203  lung]
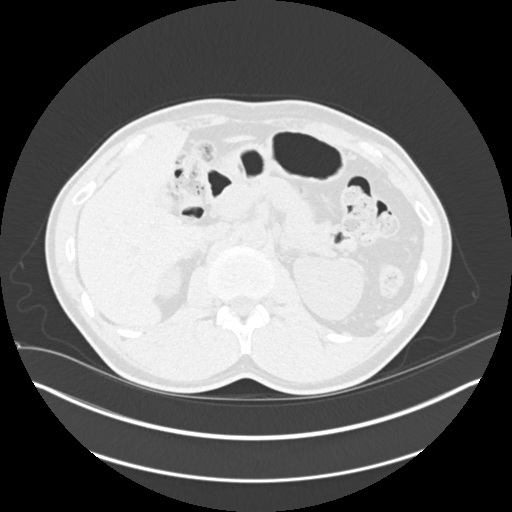
[im 32/203  lung]
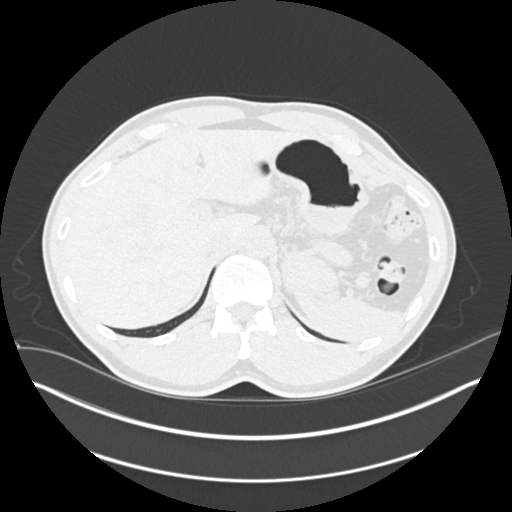
[im 47/203  lung]
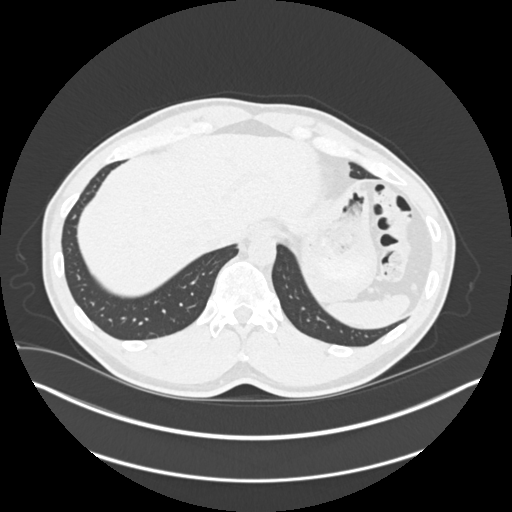
[im 63/203  lung]
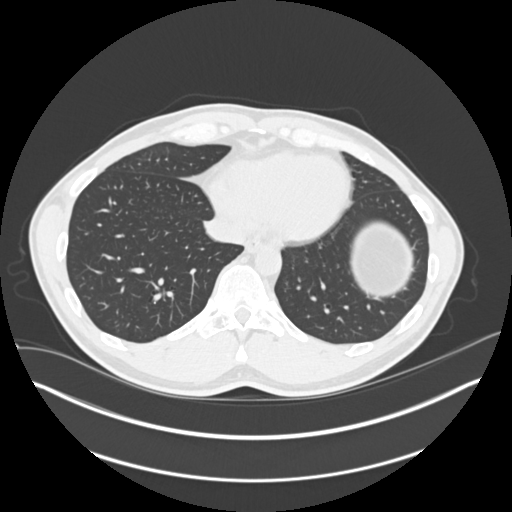
[im 78/203  mediastinal]
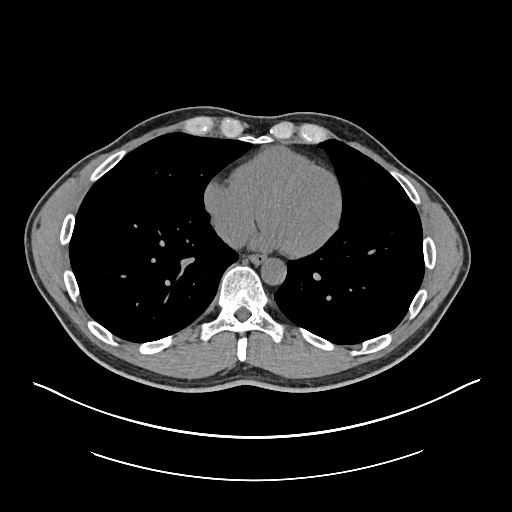
[im 78/203  lung]
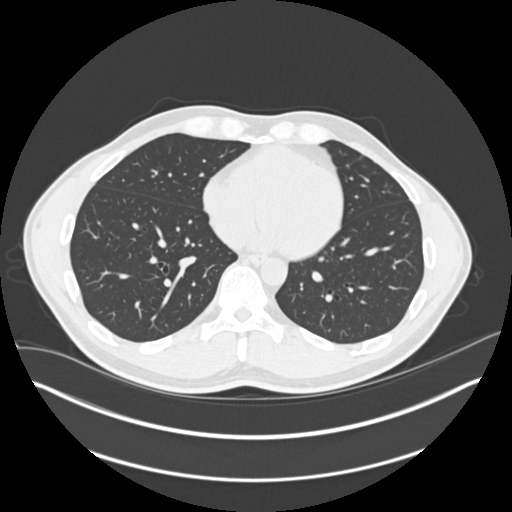
[im 94/203  lung]
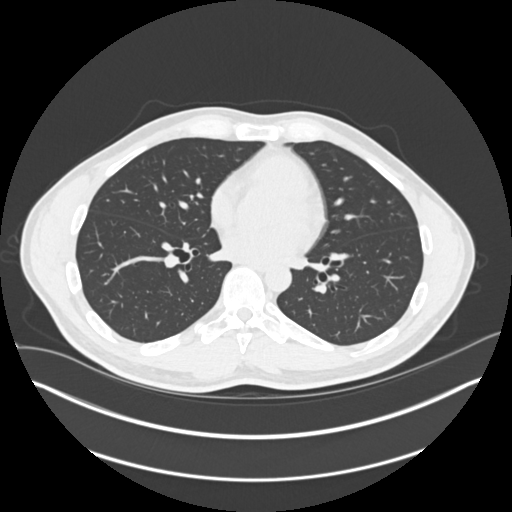
[im 109/203  lung]
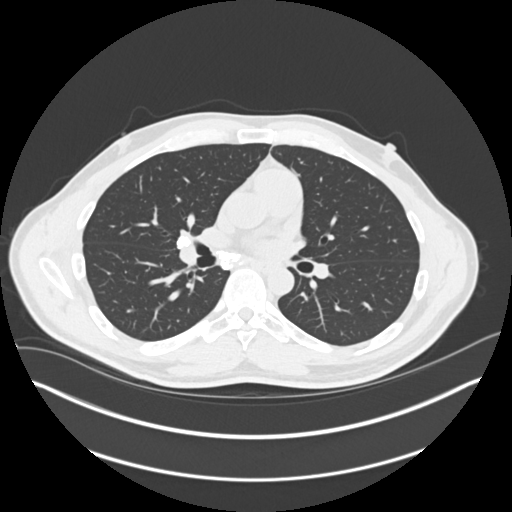
[im 125/203  lung]
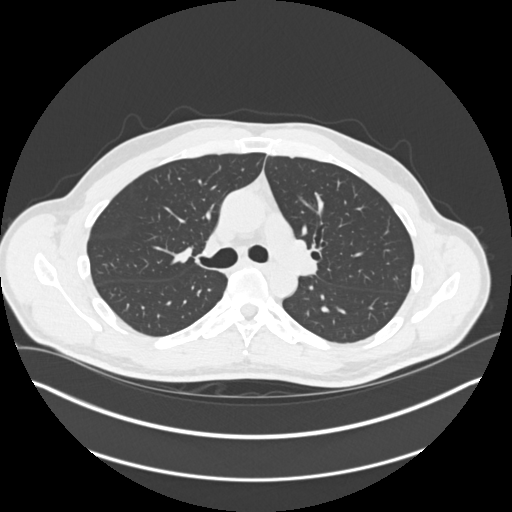
[im 140/203  mediastinal]
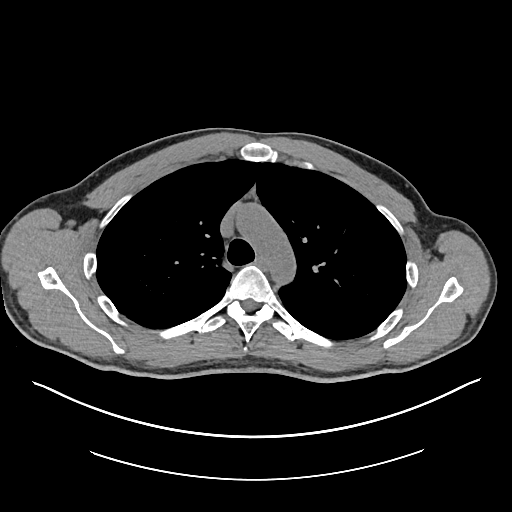
[im 140/203  lung]
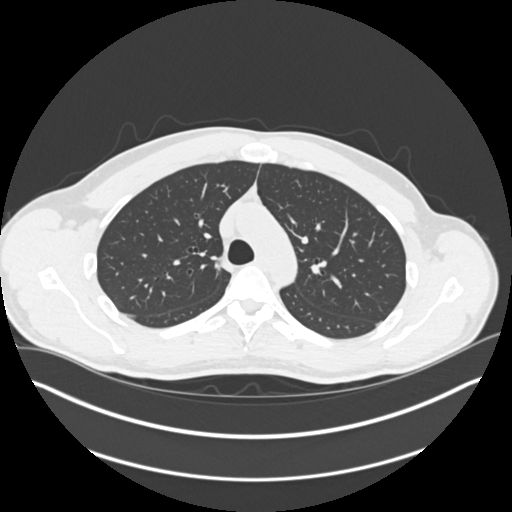
[im 156/203  lung]
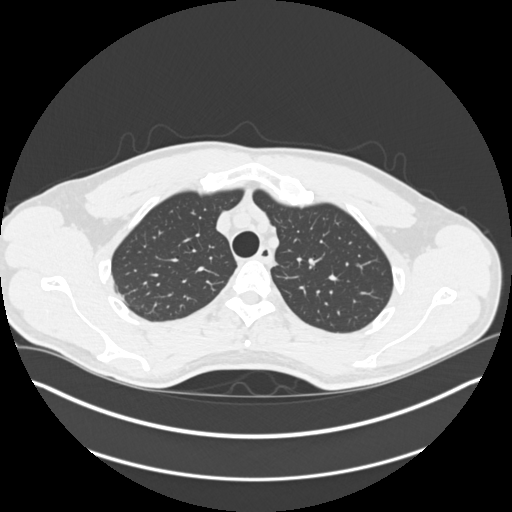
[im 171/203  lung]
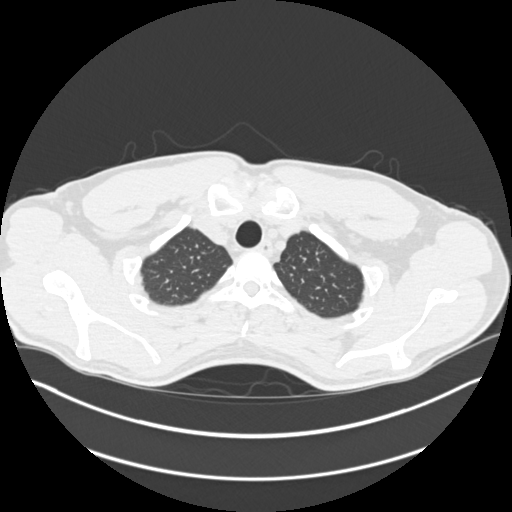
[im 187/203  lung]
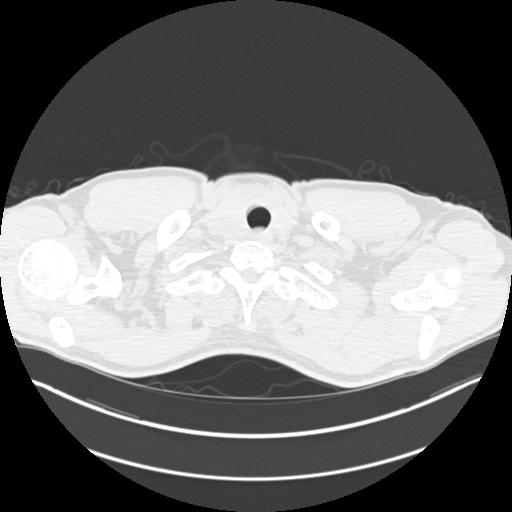

[Series 5: coronals chest 2.00 cor · coronal · 0.79mm/px · 3 of 176 slices shown]
[im 36/176  lung]
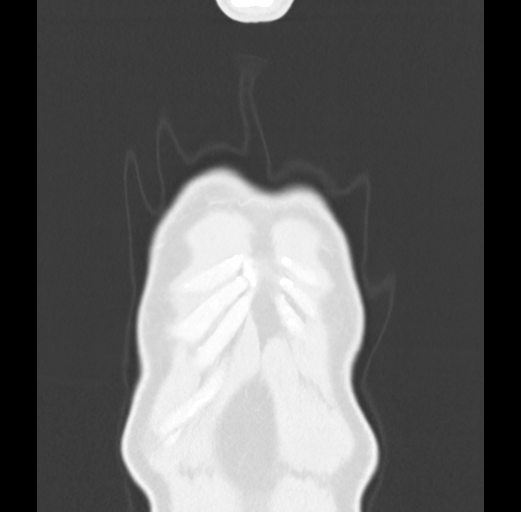
[im 71/176  lung]
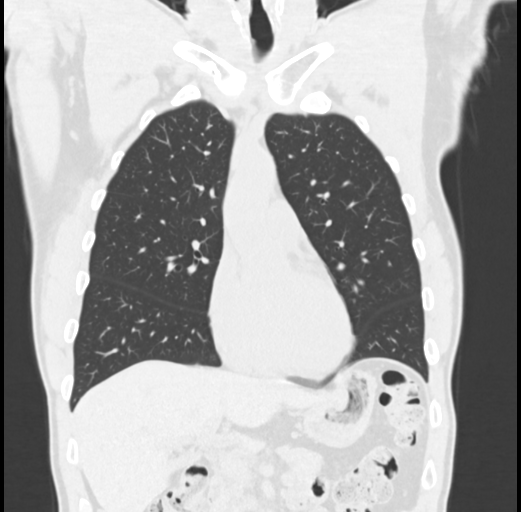
[im 106/176  lung]
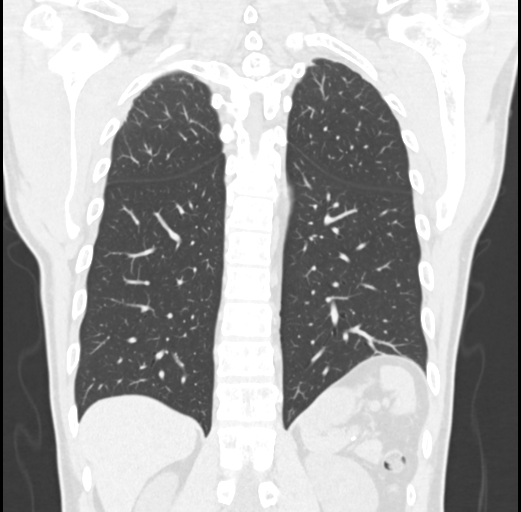

[15 of 36 positions shown; findings below may reference images not displayed]

FINDINGS: Cardiovascular: The heart is normal in size. No pericardial
effusion.

No evidence of thoracic aortic aneurysm.

Very mild coronary atherosclerosis of the LAD.

Mediastinum/Nodes: Calcified mediastinal and right hilar nodes.

Visualized thyroid is unremarkable.

Lungs/Pleura: Minimal biapical pleural-parenchymal scarring.

No focal consolidation.

Clustered granulomata in the right lower lobe (series 3/image 145),
benign.

No suspicious pulmonary nodules. Specifically, no right upper lobe
nodule to correspond to the radiographic abnormality, which may have
been osseous.

No pleural effusion or pneumothorax.

Upper Abdomen: Visualized upper abdomen is grossly unremarkable.

Musculoskeletal: Visualized osseous structures are within normal
limits.
IMPRESSION: Sequela of prior granulomatous disease, as above.

No suspicious pulmonary nodules. Specifically, no right upper lobe
nodule to correspond to the radiographic abnormality.

## 2022-12-31 ENCOUNTER — Emergency Department
Admission: EM | Admit: 2022-12-31 | Discharge: 2022-12-31 | Disposition: A | Discharge status: Home / Self Care | Payer: Self-pay | Attending: Emergency Medicine | Admitting: Emergency Medicine

## 2022-12-31 ENCOUNTER — Other Ambulatory Visit: Payer: Self-pay

## 2022-12-31 DIAGNOSIS — M436 Torticollis: Secondary | ICD-10-CM | POA: Insufficient documentation

## 2022-12-31 DIAGNOSIS — S161XXA Strain of muscle, fascia and tendon at neck level, initial encounter: Secondary | ICD-10-CM | POA: Insufficient documentation

## 2022-12-31 DIAGNOSIS — Y990 Civilian activity done for income or pay: Secondary | ICD-10-CM | POA: Insufficient documentation

## 2022-12-31 DIAGNOSIS — X500XXA Overexertion from strenuous movement or load, initial encounter: Secondary | ICD-10-CM | POA: Insufficient documentation

## 2022-12-31 MED ORDER — CYCLOBENZAPRINE HCL 5 MG PO TABS: 5.0000 mg | ORAL_TABLET | Freq: Three times a day (TID) | ORAL | 0 refills | Status: DC | PRN

## 2022-12-31 MED ORDER — NAPROXEN 500 MG PO TABS: 500.0000 mg | ORAL_TABLET | Freq: Two times a day (BID) | ORAL | 0 refills | Status: AC

## 2022-12-31 MED ORDER — NAPROXEN 500 MG PO TABS
500.0000 mg | ORAL_TABLET | Freq: Once | ORAL | Status: AC
  Administered 2022-12-31: 500 mg via ORAL
  Filled 2022-12-31: qty 1

## 2022-12-31 MED ORDER — LIDOCAINE 5 % EX PTCH: 1.0000 | MEDICATED_PATCH | Freq: Two times a day (BID) | CUTANEOUS | 0 refills | Status: AC | PRN

## 2022-12-31 MED ORDER — LIDOCAINE 5 % EX PTCH
1.0000 | MEDICATED_PATCH | Freq: Once | CUTANEOUS | Status: DC
  Administered 2022-12-31: 1 via TRANSDERMAL
  Filled 2022-12-31: qty 1

## 2022-12-31 NOTE — ED Triage Notes (Signed)
Pt states he has neck and left shoulder pain x 1 week, pt states he has had problem on and off with the same for the past few years. Pt denies any known injury, states he feels like he slept on it wrong. 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

## 2022-12-31 NOTE — ED Provider Notes (Signed)
[facility] Emergency Department Provider Note     None    (approximate)   History   Neck Pain and Shoulder Pain   HPI  Casey Mccormick is a 62 y.o. male with a noncontributory medical history, presents to the ED with a 1 week complaint of left-sided neck and upper shoulder pain.  Patient reports similar symptoms in the past intermittently.  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  He denies any distal paresthesia, GERD changes, chest pain, or shortness of breath.  Physical Exam   Triage Vital Signs: ED Triage Vitals  Enc Vitals Group     BP 12/31/22 1926 122/78     Pulse Rate 12/31/22 1926 83     Resp 12/31/22 1926 18     Temp 12/31/22 1926 98 F (36.7 C)     Temp Source 12/31/22 1926 Oral     SpO2 12/31/22 1926 100 %     Weight 12/31/22 1925 184 lb (83.5 kg)     Height 12/31/22 1925 6\' 2"  (1.88 m)     Head Circumference --      Peak Flow --      Pain Score 12/31/22 1925 7     Pain Loc --      Pain Edu? --      Excl. in GC? --     Most recent vital signs: Vitals:   12/31/22 1926 12/31/22 2039  BP: 122/78 129/72  Pulse: 83 79  Resp: 18 16  Temp: 98 F (36.7 C) 98 F (36.7 C)  SpO2: 100% 100%    General Awake, no distress. NAD HEENT NCAT. PERRL. EOMI. No rhinorrhea. Mucous membranes are moist.  CV:  Good peripheral perfusion. RRR RESP:  Normal effort. CTA ABD:  No distention.  MSK:  Normal spinal alignment without midline tenderness, spasm, deformity, or step-off.  Patient with tenderness palpation to the left upper trapezius region.  Normal composite fist bilaterally.  Normal active range of motion.  No evidence of rotator cuff deficit. NEURO: Cranial nerves II to XII grossly intact.  Normal UE DTRs bilaterally.   ED Results / Procedures / Treatments   Labs (all labs ordered are listed, but only abnormal results are displayed) Labs Reviewed - No data  to display   EKG   RADIOLOGY  No results found.   PROCEDURES:  Critical Care performed: No  Procedures   MEDICATIONS ORDERED IN ED: Medications  naproxen (NAPROSYN) tablet 500 mg (500 mg Oral Given 12/31/22 2039)     IMPRESSION / MDM / ASSESSMENT AND PLAN / ED COURSE  I reviewed the triage vital signs and the nursing notes.                              Differential diagnosis includes, but is not limited to, cervical strain, torticollis, rotator cuff injury, myalgia this  Patient's presentation is most consistent with acute, uncomplicated illness.  Patient's diagnosis is consistent with neck strain and torticollis on the left.  Patient with reassuring exam without evidence of acute neuromuscular deficit.  Patient will be discharged home with prescriptions for cyclobenzaprine, naproxen, and Lidoderm patches patient is to follow up with her primary provider or local urgent care for ongoing symptoms, as needed or otherwise directed. Patient is given ED precautions to return to the ED for any worsening or new symptoms.  FINAL CLINICAL IMPRESSION(S) / ED DIAGNOSES   Final diagnoses:  Strain of neck muscle, initial encounter  Torticollis     Rx / DC Orders   ED Discharge Orders          Ordered    cyclobenzaprine (FLEXERIL) 5 MG tablet  3 times daily PRN        12/31/22 2031    naproxen (NAPROSYN) 500 MG tablet  2 times daily with meals        12/31/22 2031    lidocaine (LIDODERM) 5 %  Every 12 hours PRN        12/31/22 2031             Note:  This document was prepared using Dragon voice recognition software and may include unintentional dictation errors.    Lissa Hoard, PA-C 01/01/23 2350    Jene Every, MD 01/02/23 1048

## 2022-12-31 NOTE — Discharge Instructions (Signed)
Your exam is consistent with an acute muscle strain to the neck.  You may take the prescription meds as directed.  Apply ice and/or moist heat to help reduce muscle spasm and stiffness.  Follow-up with [facility] healthcare as [facility], or one of the community clinics listed below. Please go to the following website to schedule new (and existing) patient appointments:   [email or web address]   The following is a list of primary care offices in the area who are accepting new patients at this time.  Please reach out to one of them directly and let them know you would like to schedule an appointment to follow up on an Emergency Department visit, and/or to establish a new primary care provider (PCP).  There are likely other primary care clinics in the are who are accepting new patients, but this is an excellent place to start:  [facility] Lead physician: Dr Shirlee Latch [address] [place], [place] [address] [phone]  [facility] Lead Physician: Dr Alba Cory [address], [place], [place] [address] [phone]  [facility]  Lead Physician: Dr Olevia Perches [address] [place], [place] [address] [phone]  [facility] Lead Physician: Dr Sofie Hartigan [address], [place], [place] [address] [phone]  [facility] at [facility] Lead Physician: Dr Bari Edward [address] [place], [place], [place] [address] [phone]

## 2023-05-25 ENCOUNTER — Emergency Department
Admission: EM | Admit: 2023-05-25 | Discharge: 2023-05-25 | Disposition: A | Discharge status: Home / Self Care | Payer: Self-pay | Attending: Emergency Medicine | Admitting: Emergency Medicine

## 2023-05-25 ENCOUNTER — Emergency Department: Payer: Self-pay

## 2023-05-25 ENCOUNTER — Encounter: Payer: Self-pay | Admitting: Emergency Medicine

## 2023-05-25 ENCOUNTER — Other Ambulatory Visit: Payer: Self-pay

## 2023-05-25 DIAGNOSIS — S161XXA Strain of muscle, fascia and tendon at neck level, initial encounter: Secondary | ICD-10-CM | POA: Insufficient documentation

## 2023-05-25 DIAGNOSIS — X58XXXA Exposure to other specified factors, initial encounter: Secondary | ICD-10-CM | POA: Insufficient documentation

## 2023-05-25 DIAGNOSIS — A084 Viral intestinal infection, unspecified: Secondary | ICD-10-CM | POA: Insufficient documentation

## 2023-05-25 DIAGNOSIS — Z20822 Contact with and (suspected) exposure to covid-19: Secondary | ICD-10-CM | POA: Insufficient documentation

## 2023-05-25 DIAGNOSIS — K2970 Gastritis, unspecified, without bleeding: Secondary | ICD-10-CM

## 2023-05-25 LAB — RESP PANEL BY RT-PCR (RSV, FLU A&B, COVID)  RVPGX2
Influenza A by PCR: NEGATIVE
Influenza B by PCR: NEGATIVE
Resp Syncytial Virus by PCR: NEGATIVE
SARS Coronavirus 2 by RT PCR: NEGATIVE

## 2023-05-25 MED ORDER — NAPROXEN 500 MG PO TABS: 500.0000 mg | ORAL_TABLET | Freq: Two times a day (BID) | ORAL | 0 refills | Status: DC

## 2023-05-25 NOTE — ED Triage Notes (Signed)
Pt here with emesis since last night, denies abd pain. Pt states his neck and upper shoulders are very sore, pt denies injury or fall. Pt thinks he could just be getting old but wants to make sure nothing is wrong. Pt ambulatory to triage.

## 2023-05-25 NOTE — Discharge Instructions (Signed)
Follow with your primary care provider or urgent care if any continued problems.  Clear liquids remainder of today and bland food.  Avoid anything spicy or with acidic acid such as tomato juice, tomato paste, spaghetti sauces, pizza.  Tylenol as needed. Also a prescription for an anti-inflammatory was sent to the pharmacy for you to take as needed for neck pain.  This can be taken while you are driving as it should not cause any drowsiness.  Moist heat to your neck as needed for discomfort.

## 2023-05-25 NOTE — ED Notes (Signed)
See triage note  Presents with soreness to neck and right arm and into back  Denies any injury or fever  Ambulates well  Afebrile on arrival

## 2023-05-25 NOTE — ED Provider Notes (Signed)
[facility] Provider Note    Event Date/Time   First MD Initiated Contact with Patient 05/25/23 [phone]     (approximate)   History   Emesis   HPI  Casey Mccormick is a 62 y.o. male presents to the ED with complaint of vomiting last evening.  Patient states he vomited several times but denies any fever, chills, diarrhea or bodyaches.  Patient states that his neck has been hurting but also was hurting prior to the vomiting.  No recent injury.  Denies any paresthesias.  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  He is unaware of any known sick exposures.     Physical Exam   Triage Vital Signs: ED Triage Vitals  Encounter Vitals Group     BP 05/25/23 0935 122/82     Systolic BP Percentile --      Diastolic BP Percentile --      Pulse Rate 05/25/23 0935 68     Resp 05/25/23 0935 18     Temp 05/25/23 0935 (!) 97.5 F (36.4 C)     Temp Source 05/25/23 0935 Oral     SpO2 05/25/23 0935 99 %     Weight 05/25/23 0936 184 lb 1.4 oz (83.5 kg)     Height 05/25/23 0936 6\' 2"  (1.88 m)     Head Circumference --      Peak Flow --      Pain Score 05/25/23 0936 7     Pain Loc --      Pain Education --      Exclude from Growth Chart --     Most recent vital signs: Vitals:   05/25/23 0935  BP: 122/82  Pulse: 68  Resp: 18  Temp: (!) 97.5 F (36.4 C)  SpO2: 99%     General: Awake, no distress.  CV:  Good peripheral perfusion.  Heart rate and rate rhythm. Resp:  Normal effort.  Lungs are clear bilaterally. Abd:  No distention.  Soft, flat, nontender.  Bowel sounds normoactive x 4 quadrants. Other:  Tenderness noted cervical spine on palpation posteriorly.  No soft tissue edema or discoloration noted.  No evidence of injury.  Patient range of motion is without limitation and no rigidity noted.   ED Results / Procedures / Treatments   Labs (all labs ordered are listed, but only abnormal results are displayed) Labs  Reviewed  RESP PANEL BY RT-PCR (RSV, FLU A&B, COVID)  [id number]     RADIOLOGY Cervical spine x-ray images were reviewed by myself independent of the radiologist and showed degenerative changes but no acute bony abnormality.    PROCEDURES:  Critical Care performed:   Procedures   MEDICATIONS ORDERED IN ED: Medications - No data to display   IMPRESSION / MDM / ASSESSMENT AND PLAN / ED COURSE  I reviewed the triage vital signs and the nursing notes.   Differential diagnosis includes, but is not limited to, viral illness, COVID, influenza, RSV, cervical strain, cervical pain, degenerative disc disease.  62 year old male presents to the ED with complaint of vomiting last evening but no vomiting or complaint of nausea while in the emergency department.  Patient is reassured that his respiratory swab is negative.  Cervical spine x-ray images were negative for any acute changes but patient was made aware of chronic changes.  We discussed once his stomach is feeling better began using naproxen 500 mg twice daily with food.  He was offered a  prescription for Zofran which she states he does not need at this time.  We discussed using Tylenol until he is back to eating normally.  Follow-up with his PCP or urgent care if any continued problems.      Patient's presentation is most consistent with acute illness / injury with system symptoms.  FINAL CLINICAL IMPRESSION(S) / ED DIAGNOSES   Final diagnoses:  Viral gastritis  Cervical strain, acute, initial encounter     Rx / DC Orders   ED Discharge Orders          Ordered    naproxen (NAPROSYN) 500 MG tablet  2 times daily with meals        05/25/23 1135             Note:  This document was prepared using Dragon voice recognition software and may include unintentional dictation errors.   Tommi Rumps, PA-C 05/25/23 1322    Sharman Cheek, MD 05/25/23 1531

## 2023-12-31 ENCOUNTER — Encounter: Payer: Self-pay | Admitting: *Deleted

## 2023-12-31 ENCOUNTER — Other Ambulatory Visit: Payer: Self-pay

## 2023-12-31 ENCOUNTER — Emergency Department
Admission: EM | Admit: 2023-12-31 | Discharge: 2023-12-31 | Disposition: A | Discharge status: Home / Self Care | Payer: Self-pay | Attending: Emergency Medicine | Admitting: Emergency Medicine

## 2023-12-31 ENCOUNTER — Emergency Department: Payer: Self-pay

## 2023-12-31 DIAGNOSIS — S46012A Strain of muscle(s) and tendon(s) of the rotator cuff of left shoulder, initial encounter: Secondary | ICD-10-CM

## 2023-12-31 DIAGNOSIS — M779 Enthesopathy, unspecified: Secondary | ICD-10-CM

## 2023-12-31 DIAGNOSIS — M778 Other enthesopathies, not elsewhere classified: Secondary | ICD-10-CM | POA: Insufficient documentation

## 2023-12-31 DIAGNOSIS — X58XXXA Exposure to other specified factors, initial encounter: Secondary | ICD-10-CM | POA: Insufficient documentation

## 2023-12-31 DIAGNOSIS — S43422A Sprain of left rotator cuff capsule, initial encounter: Secondary | ICD-10-CM | POA: Insufficient documentation

## 2023-12-31 MED ORDER — BACLOFEN 10 MG PO TABS: 10.0000 mg | ORAL_TABLET | Freq: Three times a day (TID) | ORAL | 0 refills | Status: AC

## 2023-12-31 MED ORDER — MELOXICAM 15 MG PO TABS: 15.0000 mg | ORAL_TABLET | Freq: Every day | ORAL | 2 refills | Status: AC

## 2023-12-31 NOTE — ED Notes (Signed)
 Pt reports pain to the left side of his neck and shoulder. Pt states the pain radiates to his elbow but, when he puts pressure on his elbow the pain shoots down his arm.

## 2023-12-31 NOTE — Discharge Instructions (Signed)
 Follow-up with orthopedics.  Please call for an appointment.  Take the medications as prescribed.  Apply ice to the left shoulder.  Wear the sling as needed for comfort.  Return emergency department worsening Do not take the muscle relaxer while you are driving.  It is an 8-hour medication so if you take it before bed it will wear off before you go to work

## 2023-12-31 NOTE — ED Provider Notes (Signed)
 [facility] Provider Note    Event Date/Time   First MD Initiated Contact with Patient 12/31/23 1911     (approximate)   History   Torticollis   HPI  Casey Mccormick is a 63 y.o. male with no significant medical history presents emergency department complaining of left-sided neck and shoulder pain that is reproduced with movement.  Patient states symptoms been ongoing for 1.5 weeks.  He is a Agricultural consultant.  Also breaks horses.  States he is very active.  The left shoulder hurts when he tries to reach behind him.  Does hurt when he rolls over onto it at night.  States his C-spine does not hurt but not neck muscles to hurt.  Denies numbness/tingling      Physical Exam   Triage Vital Signs: ED Triage Vitals [12/31/23 1856]  Encounter Vitals Group     BP 128/72     Girls Systolic BP Percentile      Girls Diastolic BP Percentile      Boys Systolic BP Percentile      Boys Diastolic BP Percentile      Pulse Rate 72     Resp 18     Temp 98.1 F (36.7 C)     Temp Source Oral     SpO2 99 %     Weight 188 lb (85.3 kg)     Height 6' 2 (1.88 m)     Head Circumference      Peak Flow      Pain Score 7     Pain Loc      Pain Education      Exclude from Growth Chart     Most recent vital signs: Vitals:   12/31/23 1856  BP: 128/72  Pulse: 72  Resp: 18  Temp: 98.1 F (36.7 C)  SpO2: 99%     General: Awake, no distress.   CV:  Good peripheral perfusion.  Resp:  Normal effort.  Abd:  No distention.   Other:  Left shoulder tender at the joint, decreased range of motion with overhead reach, internal rotation, grips equal bilaterally, neurovascular intact, C-spine is nontender, trapezius is spasmed   ED Results / Procedures / Treatments   Labs (all labs ordered are listed, but only abnormal results are displayed) Labs Reviewed - No data to display   EKG     RADIOLOGY X-ray left  shoulder    PROCEDURES:   Procedures  Critical Care:  no Chief Complaint  Patient presents with   Torticollis      MEDICATIONS ORDERED IN ED: Medications - No data to display   IMPRESSION / MDM / ASSESSMENT AND PLAN / ED COURSE  I reviewed the triage vital signs and the nursing notes.                              Differential diagnosis includes, but is not limited to, capsulitis, tendinitis, rotator cuff strain, rotator cuff tear, muscle spasm  Patient's presentation is most consistent with acute illness / injury with system symptoms.   X-ray left shoulder   X-ray of the left shoulder was independently reviewed interpreted by me as being negative for any acute abnormality, confirmed by radiology  I did explain findings to patient.  He was given a sling for comfort.  Meloxicam and baclofen as prescriptions at discharge.  He is to apply ice.  Follow-up with orthopedics.  If he  cannot get into see someone at [facility] clinic within the next week or so then he can see someone at the walk-in clinic at [facility].  He is in agreement with this treatment plan.  He was discharged stable condition.   FINAL CLINICAL IMPRESSION(S) / ED DIAGNOSES   Final diagnoses:  Strain of left rotator cuff capsule, initial encounter  Tendonitis     Rx / DC Orders   ED Discharge Orders          Ordered    meloxicam (MOBIC) 15 MG tablet  Daily        12/31/23 2016    baclofen (LIORESAL) 10 MG tablet  3 times daily        12/31/23 2016             Note:  This document was prepared using Dragon voice recognition software and may include unintentional dictation errors.    Gasper Devere ORN, PA-C 12/31/23 2018    Jacolyn Pae, MD 12/31/23 [phone]

## 2023-12-31 NOTE — ED Triage Notes (Signed)
 Pt ambulatory to triage.  Pt has pain in left side of neck and shoulder.  Sx for 1.5 weeks.  Painful when sleeping.  No known injury.  Pt alert  speech clear.
